# Patient Record
Sex: Male | Born: 1940 | Race: Black or African American | Hispanic: No | Smoking: Never smoker
Health system: Southern US, Community
[De-identification: ages and names within clinical notes are randomized; demographics above are authoritative.]

## PROBLEM LIST (undated history)

## (undated) DIAGNOSIS — E119 Type 2 diabetes mellitus without complications: Secondary | ICD-10-CM

## (undated) DIAGNOSIS — I10 Essential (primary) hypertension: Secondary | ICD-10-CM

## (undated) DIAGNOSIS — N189 Chronic kidney disease, unspecified: Secondary | ICD-10-CM

## (undated) DIAGNOSIS — R011 Cardiac murmur, unspecified: Secondary | ICD-10-CM

---

## 2015-10-18 ENCOUNTER — Inpatient Hospital Stay (HOSPITAL_COMMUNITY): Payer: Medicare Other

## 2015-10-18 ENCOUNTER — Inpatient Hospital Stay (HOSPITAL_COMMUNITY)
Admission: EM | Admit: 2015-10-18 | Discharge: 2015-10-19 | DRG: 948 | Disposition: A | Discharge status: Home Health | Payer: Medicare Other | Attending: Internal Medicine | Admitting: Internal Medicine

## 2015-10-18 ENCOUNTER — Encounter (HOSPITAL_COMMUNITY): Payer: Self-pay | Admitting: Emergency Medicine

## 2015-10-18 DIAGNOSIS — I129 Hypertensive chronic kidney disease with stage 1 through stage 4 chronic kidney disease, or unspecified chronic kidney disease: Secondary | ICD-10-CM | POA: Diagnosis not present

## 2015-10-18 DIAGNOSIS — I509 Heart failure, unspecified: Secondary | ICD-10-CM | POA: Diagnosis not present

## 2015-10-18 DIAGNOSIS — R011 Cardiac murmur, unspecified: Secondary | ICD-10-CM | POA: Diagnosis not present

## 2015-10-18 DIAGNOSIS — N184 Chronic kidney disease, stage 4 (severe): Secondary | ICD-10-CM | POA: Diagnosis present

## 2015-10-18 DIAGNOSIS — N170 Acute kidney failure with tubular necrosis: Secondary | ICD-10-CM

## 2015-10-18 DIAGNOSIS — D631 Anemia in chronic kidney disease: Secondary | ICD-10-CM | POA: Diagnosis present

## 2015-10-18 DIAGNOSIS — E1122 Type 2 diabetes mellitus with diabetic chronic kidney disease: Secondary | ICD-10-CM | POA: Diagnosis present

## 2015-10-18 DIAGNOSIS — E119 Type 2 diabetes mellitus without complications: Secondary | ICD-10-CM | POA: Diagnosis present

## 2015-10-18 DIAGNOSIS — R809 Proteinuria, unspecified: Secondary | ICD-10-CM | POA: Diagnosis present

## 2015-10-18 DIAGNOSIS — I10 Essential (primary) hypertension: Secondary | ICD-10-CM | POA: Diagnosis present

## 2015-10-18 DIAGNOSIS — Z23 Encounter for immunization: Secondary | ICD-10-CM

## 2015-10-18 DIAGNOSIS — R531 Weakness: Secondary | ICD-10-CM

## 2015-10-18 DIAGNOSIS — R0602 Shortness of breath: Secondary | ICD-10-CM

## 2015-10-18 DIAGNOSIS — Z79899 Other long term (current) drug therapy: Secondary | ICD-10-CM

## 2015-10-18 DIAGNOSIS — E785 Hyperlipidemia, unspecified: Secondary | ICD-10-CM | POA: Diagnosis present

## 2015-10-18 DIAGNOSIS — N289 Disorder of kidney and ureter, unspecified: Secondary | ICD-10-CM

## 2015-10-18 DIAGNOSIS — N179 Acute kidney failure, unspecified: Secondary | ICD-10-CM | POA: Diagnosis present

## 2015-10-18 HISTORY — DX: Chronic kidney disease, unspecified: N18.9

## 2015-10-18 HISTORY — DX: Type 2 diabetes mellitus without complications: E11.9

## 2015-10-18 HISTORY — DX: Essential (primary) hypertension: I10

## 2015-10-18 HISTORY — DX: Cardiac murmur, unspecified: R01.1

## 2015-10-18 LAB — CK: Total CK: 66 U/L (ref 49–397)

## 2015-10-18 LAB — BASIC METABOLIC PANEL
Anion gap: 11 (ref 5–15)
BUN: 35 mg/dL — AB (ref 6–20)
CALCIUM: 9.1 mg/dL (ref 8.9–10.3)
CHLORIDE: 107 mmol/L (ref 101–111)
CO2: 16 mmol/L — ABNORMAL LOW (ref 22–32)
CREATININE: 3.75 mg/dL — AB (ref 0.61–1.24)
GFR calc Af Amer: 17 mL/min — ABNORMAL LOW (ref >60)
GFR calc non Af Amer: 14 mL/min — ABNORMAL LOW (ref >60)
Glucose, Bld: 194 mg/dL — ABNORMAL HIGH (ref 65–99)
Potassium: 4.1 mmol/L (ref 3.5–5.1)
SODIUM: 134 mmol/L — AB (ref 135–145)

## 2015-10-18 LAB — CBC
HCT: 29.5 % — ABNORMAL LOW (ref 39.0–52.0)
Hemoglobin: 9.9 g/dL — ABNORMAL LOW (ref 13.0–17.0)
MCH: 25.5 pg — AB (ref 26.0–34.0)
MCHC: 33.6 g/dL (ref 30.0–36.0)
MCV: 76 fL — AB (ref 78.0–100.0)
PLATELETS: 186 10*3/uL (ref 150–400)
RBC: 3.88 MIL/uL — ABNORMAL LOW (ref 4.22–5.81)
RDW: 15.6 % — AB (ref 11.5–15.5)
WBC: 6.6 10*3/uL (ref 4.0–10.5)

## 2015-10-18 LAB — URINALYSIS, ROUTINE W REFLEX MICROSCOPIC
Bilirubin Urine: NEGATIVE
GLUCOSE, UA: 100 mg/dL — AB
Ketones, ur: NEGATIVE mg/dL
Leukocytes, UA: NEGATIVE
Nitrite: NEGATIVE
Protein, ur: >300 mg/dL — AB
SPECIFIC GRAVITY, URINE: 1.015 (ref 1.005–1.030)
pH: 5.5 (ref 5.0–8.0)

## 2015-10-18 LAB — ECHOCARDIOGRAM COMPLETE
Height: 67 in
Weight: 3054.69 oz

## 2015-10-18 LAB — HEPATIC FUNCTION PANEL
ALK PHOS: 62 U/L (ref 38–126)
ALT: 10 U/L — AB (ref 17–63)
AST: 15 U/L (ref 15–41)
Albumin: 3.7 g/dL (ref 3.5–5.0)
BILIRUBIN DIRECT: 0.1 mg/dL (ref 0.1–0.5)
BILIRUBIN INDIRECT: 0.8 mg/dL (ref 0.3–0.9)
BILIRUBIN TOTAL: 0.9 mg/dL (ref 0.3–1.2)
Total Protein: 8.3 g/dL — ABNORMAL HIGH (ref 6.5–8.1)

## 2015-10-18 LAB — CREATININE, URINE, RANDOM: Creatinine, Urine: 143.38 mg/dL

## 2015-10-18 LAB — FERRITIN: FERRITIN: 176 ng/mL (ref 24–336)

## 2015-10-18 LAB — CBG MONITORING, ED: Glucose-Capillary: 212 mg/dL — ABNORMAL HIGH (ref 65–99)

## 2015-10-18 LAB — URINE MICROSCOPIC-ADD ON

## 2015-10-18 LAB — GLUCOSE, CAPILLARY
GLUCOSE-CAPILLARY: 215 mg/dL — AB (ref 65–99)
Glucose-Capillary: 181 mg/dL — ABNORMAL HIGH (ref 65–99)

## 2015-10-18 LAB — FOLATE: Folate: 7.9 ng/mL (ref >5.9)

## 2015-10-18 LAB — OSMOLALITY: Osmolality: 300 mOsm/kg — ABNORMAL HIGH (ref 275–295)

## 2015-10-18 LAB — IRON AND TIBC
IRON: 27 ug/dL — AB (ref 45–182)
Saturation Ratios: 10 % — ABNORMAL LOW (ref 17.9–39.5)
TIBC: 266 ug/dL (ref 250–450)
UIBC: 239 ug/dL

## 2015-10-18 LAB — OSMOLALITY, URINE: Osmolality, Ur: 392 mOsm/kg (ref 300–900)

## 2015-10-18 LAB — RETICULOCYTES
RBC.: 3.99 MIL/uL — AB (ref 4.22–5.81)
RETIC COUNT ABSOLUTE: 119.7 10*3/uL (ref 19.0–186.0)
Retic Ct Pct: 3 % (ref 0.4–3.1)

## 2015-10-18 LAB — SODIUM, URINE, RANDOM: SODIUM UR: 60 mmol/L

## 2015-10-18 LAB — VITAMIN B12: Vitamin B-12: 278 pg/mL (ref 180–914)

## 2015-10-18 LAB — TSH: TSH: 1.64 u[IU]/mL (ref 0.350–4.500)

## 2015-10-18 MED ORDER — SODIUM CHLORIDE 0.9 % IV BOLUS (SEPSIS)
500.0000 mL | Freq: Once | INTRAVENOUS | Status: AC
  Administered 2015-10-18: 500 mL via INTRAVENOUS

## 2015-10-18 MED ORDER — NIFEDIPINE ER OSMOTIC RELEASE 90 MG PO TB24
90.0000 mg | ORAL_TABLET | Freq: Every day | ORAL | Status: DC
  Administered 2015-10-18 – 2015-10-19 (×2): 90 mg via ORAL
  Filled 2015-10-18 (×2): qty 1

## 2015-10-18 MED ORDER — HYDRALAZINE HCL 20 MG/ML IJ SOLN: 10.0000 mg | Freq: Four times a day (QID) | INTRAMUSCULAR | Status: DC | PRN

## 2015-10-18 MED ORDER — ONDANSETRON HCL 4 MG/2ML IJ SOLN
4.0000 mg | Freq: Four times a day (QID) | INTRAMUSCULAR | Status: DC | PRN
Start: 2015-10-18 — End: 2015-10-19

## 2015-10-18 MED ORDER — PNEUMOCOCCAL VAC POLYVALENT 25 MCG/0.5ML IJ INJ
0.5000 mL | INJECTION | INTRAMUSCULAR | Status: AC
  Administered 2015-10-19: 0.5 mL via INTRAMUSCULAR
  Filled 2015-10-18 (×2): qty 0.5

## 2015-10-18 MED ORDER — HEPARIN SODIUM (PORCINE) 5000 UNIT/ML IJ SOLN
5000.0000 [IU] | Freq: Three times a day (TID) | INTRAMUSCULAR | Status: DC
  Administered 2015-10-18 – 2015-10-19 (×4): 5000 [IU] via SUBCUTANEOUS
  Filled 2015-10-18 (×4): qty 1

## 2015-10-18 MED ORDER — PRAVASTATIN SODIUM 80 MG PO TABS
80.0000 mg | ORAL_TABLET | Freq: Every day | ORAL | Status: DC
  Administered 2015-10-18 – 2015-10-19 (×2): 80 mg via ORAL
  Filled 2015-10-18 (×2): qty 1

## 2015-10-18 MED ORDER — ONDANSETRON HCL 4 MG PO TABS: 4.0000 mg | ORAL_TABLET | Freq: Four times a day (QID) | ORAL | Status: DC | PRN

## 2015-10-18 MED ORDER — SODIUM CHLORIDE 0.9 % IV SOLN
INTRAVENOUS | Status: AC
  Administered 2015-10-18 – 2015-10-19 (×3): via INTRAVENOUS

## 2015-10-18 MED ORDER — INSULIN ASPART 100 UNIT/ML ~~LOC~~ SOLN
0.0000 [IU] | Freq: Three times a day (TID) | SUBCUTANEOUS | Status: DC
  Administered 2015-10-18: 3 [IU] via SUBCUTANEOUS
  Administered 2015-10-19: 2 [IU] via SUBCUTANEOUS
  Administered 2015-10-19: 5 [IU] via SUBCUTANEOUS
  Administered 2015-10-19: 3 [IU] via SUBCUTANEOUS

## 2015-10-18 MED ORDER — INSULIN ASPART 100 UNIT/ML ~~LOC~~ SOLN: 0.0000 [IU] | Freq: Every day | SUBCUTANEOUS | Status: DC

## 2015-10-18 MED ORDER — AMLODIPINE BESYLATE 10 MG PO TABS
10.0000 mg | ORAL_TABLET | Freq: Every day | ORAL | Status: DC
  Administered 2015-10-18 – 2015-10-19 (×2): 10 mg via ORAL
  Filled 2015-10-18 (×2): qty 1

## 2015-10-18 MED ORDER — ALBUTEROL SULFATE (2.5 MG/3ML) 0.083% IN NEBU: 2.5000 mg | INHALATION_SOLUTION | RESPIRATORY_TRACT | Status: DC | PRN

## 2015-10-18 MED ORDER — HYDROCODONE-ACETAMINOPHEN 5-325 MG PO TABS: 1.0000 | ORAL_TABLET | ORAL | Status: DC | PRN

## 2015-10-18 MED ORDER — CARVEDILOL 25 MG PO TABS
25.0000 mg | ORAL_TABLET | Freq: Two times a day (BID) | ORAL | Status: DC
  Administered 2015-10-18 – 2015-10-19 (×3): 25 mg via ORAL
  Filled 2015-10-18 (×3): qty 1

## 2015-10-18 NOTE — Progress Notes (Signed)
Pt states he is a full time [place] resident and here visiting a friend.  He rents a home here when he visits.    Attempted to obtain medical records [facility] [phone]--medical records currently closed on weekend, will open on Monday.

## 2015-10-18 NOTE — ED Notes (Signed)
Attempted report 

## 2015-10-18 NOTE — ED Notes (Signed)
Bed: WA22 Expected date:  Expected time:  Means of arrival:  Comments: 

## 2015-10-18 NOTE — ED Notes (Signed)
Bed: WA20 Expected date:  Expected time:  Means of arrival:  Comments: 

## 2015-10-18 NOTE — ED Notes (Signed)
CBG measurement /dl. RN advised. ENM

## 2015-10-18 NOTE — ED Notes (Signed)
Lab would need four gold recollected ----RN have been made aware

## 2015-10-18 NOTE — ED Triage Notes (Signed)
Patient states that he was recently in [place] visiting family and since the past week he has been home having fatigue/weakness.  Patient states that he is a diabetic and does monitor them regularly.

## 2015-10-18 NOTE — ED Provider Notes (Signed)
[facility]-EMERGENCY DEPT Provider Note   CSN: [id number] Arrival date & time: 10/18/15  1030  First Provider Contact:  None       History   Chief Complaint Chief Complaint  Patient presents with  . Weakness    HPI Maurice Glover is a 75 y.o. male.  75 year old male presents with increasing weakness and fatigue for the past week. States that symptoms become worse after he takes his diabetic medication. Notes that he only eats one meal per day. Denies any recent fever, vomiting, diarrhea. No abdominal chest discomfort. No focality to his weakness. No severe headaches or visual changes. Denies polyuria or polydipsia. Has taken his blood sugar at home and has been running in the low 200s. Denies any syncope or near-syncope.      Past Medical History:  Diagnosis Date  . Diabetes mellitus without complication (HCC)   . Hypertension     There are no active problems to display for this patient.   History reviewed. No pertinent surgical history.     Home Medications    Prior to Admission medications   Medication Sig Start Date End Date Taking? Authorizing Provider  amLODipine (NORVASC) 10 MG tablet Take 10 mg by mouth daily.   Yes Historical Provider, MD  carvedilol (COREG) 25 MG tablet Take 25 mg by mouth 2 (two) times daily with a meal.   Yes Historical Provider, MD  NIFEdipine (PROCARDIA XL/ADALAT-CC) 90 MG 24 hr tablet Take 90 mg by mouth daily.   Yes Historical Provider, MD  pravastatin (PRAVACHOL) 80 MG tablet Take 80 mg by mouth daily.   Yes Historical Provider, MD  sitaGLIPtin (JANUVIA) 25 MG tablet Take 25 mg by mouth daily.   Yes Historical Provider, MD    Family History No family history on file.  Social History Social History  Substance Use Topics  . Smoking status: Never Smoker  . Smokeless tobacco: Never Used  . Alcohol use No     Allergies   Review of patient's allergies indicates no known allergies.   Review of Systems Review of Systems  All other  systems reviewed and are negative.    Physical Exam Updated Vital Signs BP 153/92 (BP Location: Right Arm)   Pulse 80   Temp 98.1 F (36.7 C) (Oral)   Resp 18   SpO2 98%   Physical Exam  Constitutional: He is oriented to person, place, and time. He appears well-developed and well-nourished.  Non-toxic appearance. No distress.  HENT:  Head: Normocephalic and atraumatic.  Eyes: Conjunctivae, EOM and lids are normal. Pupils are equal, round, and reactive to light.  Neck: Normal range of motion. Neck supple. No tracheal deviation present. No thyroid mass present.  Cardiovascular: Normal rate, regular rhythm and normal heart sounds.  Exam reveals no gallop.   No murmur heard. Pulmonary/Chest: Effort normal and breath sounds normal. No stridor. No respiratory distress. He has no decreased breath sounds. He has no wheezes. He has no rhonchi. He has no rales.  Abdominal: Soft. Normal appearance and bowel sounds are normal. He exhibits no distension. There is no tenderness. There is no rebound and no CVA tenderness.  Musculoskeletal: Normal range of motion. He exhibits no edema or tenderness.  Neurological: He is alert and oriented to person, place, and time. He has normal strength. No cranial nerve deficit or sensory deficit. GCS eye subscore is 4. GCS verbal subscore is 5. GCS motor subscore is 6.  Skin: Skin is warm and dry. No abrasion and no rash  noted.  Psychiatric: He has a normal mood and affect. His speech is normal and behavior is normal.  Nursing note and vitals reviewed.    ED Treatments / Results  Labs (all labs ordered are listed, but only abnormal results are displayed) Labs Reviewed  BASIC METABOLIC PANEL  CBC  URINALYSIS, ROUTINE W REFLEX MICROSCOPIC (NOT AT [facility])  CBG MONITORING, ED    EKG  EKG Interpretation None       Radiology No results found.  Procedures Procedures (including critical care time)  Medications Ordered in ED Medications - No data to  display   Initial Impression / Assessment and Plan / ED Course  I have reviewed the triage vital signs and the nursing notes.  Pertinent labs & imaging results that were available during my care of the patient were reviewed by me and considered in my medical decision making (see chart for details).  Clinical Course  Patient will be admitted for evaluation of his renal insufficiency  Final Clinical Impressions(s) / ED Diagnoses   Final diagnoses:  None    New Prescriptions New Prescriptions   No medications on file     Lorre Nick, MD 10/18/15 1227

## 2015-10-18 NOTE — ED Notes (Signed)
Bladder scan completed. Measurements ranging 4819ml-80ml. RN advised. ENM

## 2015-10-18 NOTE — H&P (Signed)
[facility] H&P   Patient Demographics:    Maurice Glover, is a [age] y.o. male  MRN: [id number]   DOB - 07/28/1940  Admit Date - 10/18/2015  Outpatient Primary MD for the patient is No PCP Per Patient  Outpatient Specialists: All in [place]   Patient coming from: Home in [place]  Chief Complaint  Patient presents with  . Weakness      HPI:    Maurice Crazelban Cammack  is a 75 y.o. male, History of type 2 diabetes mellitus, essential hypertension, CK D stage V he has been told in [place] that he should start preparing for dialysis who was originally from [place] and is visiting family here comes in with one-week history of generalized weakness, he says when he gets up in the morning he feels good but as the day prolongs he feels increasingly fatigued and tired, he denies any fever or chills, no headache, no chest abdominal pain, no diarrhea or dysuria, no blood in stool or urine or focal weakness.  He came to the ER where he was found to have a creatinine of above 3.5 and I was called to admit for renal failure. However I think his kidney issues are more chronic than acute. Currently negative review of systems.    Review of systems:    In addition to the HPI above,  No Fever-chills, No Headache, No changes with Vision or hearing, No problems swallowing food or Liquids, No Chest pain, Cough or Shortness of Breath, No Abdominal pain, No Nausea or Vommitting, Bowel movements are regular, No Blood in stool or Urine, No dysuria, No new skin rashes or bruises, No new joints pains-aches,  No new weakness, tingling, numbness in any extremity,Positive generalized weakness No recent weight gain or loss, No polyuria, polydypsia or  polyphagia, No significant Mental Stressors.  A full 10 point Review of Systems was done, except as stated above, all other Review of Systems were negative.   With Past History of the following :    Past Medical History:  Diagnosis Date  . Diabetes mellitus without complication (HCC)   . Hypertension       History reviewed. No pertinent surgical history.    Social History:     Social History  Substance Use Topics  . Smoking status: Never Smoker  . Smokeless  tobacco: Never Used  . Alcohol use No         Family History :   NO History of ESRD on dialysis   Home Medications:   Prior to Admission medications   Medication Sig Start Date End Date Taking? Authorizing Provider  amLODipine (NORVASC) 10 MG tablet Take 10 mg by mouth daily.   Yes Historical Provider, MD  carvedilol (COREG) 25 MG tablet Take 25 mg by mouth 2 (two) times daily with a meal.   Yes Historical Provider, MD  NIFEdipine (PROCARDIA XL/ADALAT-CC) 90 MG 24 hr tablet Take 90 mg by mouth daily.   Yes Historical Provider, MD  pravastatin (PRAVACHOL) 80 MG tablet Take 80 mg by mouth daily.   Yes Historical Provider, MD  sitaGLIPtin (JANUVIA) 25 MG tablet Take 25 mg by mouth daily.   Yes Historical Provider, MD     Allergies:    No Known Allergies   Physical Exam:   Vitals  Blood pressure 153/92, pulse 80, temperature 98.1 F (36.7 C), temperature source Oral, resp. rate 18, SpO2 98 %.   1. General African-American male lying in bed in NAD,     2. Normal affect and insight, Not Suicidal or Homicidal, Awake Alert, Oriented X 3.  3. No F.N deficits, ALL C.Nerves Intact, Strength 5/5 all 4 extremities, Sensation intact all 4 extremities, Plantars down going.  4. Ears and Eyes appear Normal, Conjunctivae clear, PERRLA. Moist Oral Mucosa.  5. Supple Neck, No JVD, No cervical lymphadenopathy appriciated, No Carotid Bruits.  6. Symmetrical Chest wall movement, Good air movement bilaterally,  CTAB.  7. RRR, No Gallops, Rubs , positive aortic systolic murmur, No Parasternal Heave.  8. Positive Bowel Sounds, Abdomen Soft, No tenderness, No organomegaly appriciated,No rebound -guarding or rigidity.  9.  No Cyanosis, Normal Skin Turgor, No Skin Rash or Bruise.  10. Good muscle tone,  joints appear normal , no effusions, Normal ROM.  11. No Palpable Lymph Nodes in Neck or Axillae      Data Review:    CBC  Recent Labs Lab 10/18/15 1123  WBC 6.6  HGB 9.9*  HCT 29.5*  PLT 186  MCV 76.0*  MCH 25.5*  MCHC 33.6  RDW 15.6*   ------------------------------------------------------------------------------------------------------------------  Chemistries   Recent Labs Lab 10/18/15 1123  NA 134*  K 4.1  CL 107  CO2 16*  GLUCOSE 194*  BUN 35*  CREATININE 3.75*  CALCIUM 9.1   ------------------------------------------------------------------------------------------------------------------ CrCl cannot be calculated (Unknown ideal weight.). ------------------------------------------------------------------------------------------------------------------ No results for input(s): TSH, T4TOTAL, T3FREE, THYROIDAB in the last 72 hours.  Invalid input(s): FREET3  Coagulation profile No results for input(s): INR, PROTIME in the last 168 hours. ------------------------------------------------------------------------------------------------------------------- No results for input(s): DDIMER in the last 72 hours. -------------------------------------------------------------------------------------------------------------------  Cardiac Enzymes No results for input(s): CKMB, TROPONINI, MYOGLOBIN in the last 168 hours.  Invalid input(s): CK ------------------------------------------------------------------------------------------------------------------ No results found for:  BNP   ---------------------------------------------------------------------------------------------------------------  Urinalysis No results found for: COLORURINE, APPEARANCEUR, LABSPEC, PHURINE, GLUCOSEU, HGBUR, BILIRUBINUR, KETONESUR, PROTEINUR, UROBILINOGEN, NITRITE, LEUKOCYTESUR  ----------------------------------------------------------------------------------------------------------------   Imaging Results:    No results found.   Baseline EKG and chest x-ray ordered   Assessment & Plan:      1. Generalized weakness. 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 Will be admitted, gentle IV fluids, supportive care, PT evaluation, anemia panel. Check chest x-ray, UA and EKG. Check TSH. We'll check CK levels and liver enzymes as he is  on high doses of statin.  2. CK D stage V. Patient has been told in [place] to prepare for dialysis, I suspect this could be his baseline creatinine however ARF cannot be ruled out, will check bladder scan and renal ultrasound, UA, gentle hydration and monitor. Avoid nephrotoxins. Have requested RN to obtain records from [place].  3. Systolic murmur. Patient unaware of the same, check echo. Likely underlying left ear.  4. Hypertension. Continue home medications and as needed IV hydrazine  5. DM type II. Check A1c for now sliding-scale hold Januvia.  6. Anemia of chronic disease. Check anemia panel.  7. Dyslipidemia. On statin continue. Check CK levels and liver enzymes due to fatigue.   DVT Prophylaxis Heparin   AM Labs Ordered, also please review Full Orders  Family Communication: Admission, patients condition and plan of care including tests being ordered have been discussed with the patient and wife who indicate understanding and agree with the plan and Code Status.  Code Status Full  Likely DC to  Home 2-3 days  Condition Fair  Consults called: None    Admission status:  Inpt    Time spent in minutes : 35   Susa Raring K M.D on 10/18/2015 at 12:49 PM  Between 7am to 7pm - Pager - [phone]. After 7pm go to [email or web address] - password [facility]  [facility] - Office  [phone]

## 2015-10-18 NOTE — Progress Notes (Signed)
  Echocardiogram 2D Echocardiogram has been performed.  Janalyn HarderWest, Mahoganie Basher R 10/18/2015, 3:50 PM

## 2015-10-19 DIAGNOSIS — R531 Weakness: Secondary | ICD-10-CM

## 2015-10-19 DIAGNOSIS — I10 Essential (primary) hypertension: Secondary | ICD-10-CM

## 2015-10-19 DIAGNOSIS — N184 Chronic kidney disease, stage 4 (severe): Secondary | ICD-10-CM

## 2015-10-19 DIAGNOSIS — E119 Type 2 diabetes mellitus without complications: Secondary | ICD-10-CM

## 2015-10-19 LAB — BASIC METABOLIC PANEL
ANION GAP: 7 (ref 5–15)
BUN: 35 mg/dL — AB (ref 6–20)
CHLORIDE: 108 mmol/L (ref 101–111)
CO2: 19 mmol/L — AB (ref 22–32)
Calcium: 8.5 mg/dL — ABNORMAL LOW (ref 8.9–10.3)
Creatinine, Ser: 3.39 mg/dL — ABNORMAL HIGH (ref 0.61–1.24)
GFR calc Af Amer: 19 mL/min — ABNORMAL LOW (ref >60)
GFR, EST NON AFRICAN AMERICAN: 16 mL/min — AB (ref >60)
GLUCOSE: 207 mg/dL — AB (ref 65–99)
POTASSIUM: 4.3 mmol/L (ref 3.5–5.1)
Sodium: 134 mmol/L — ABNORMAL LOW (ref 135–145)

## 2015-10-19 LAB — GLUCOSE, CAPILLARY
GLUCOSE-CAPILLARY: 227 mg/dL — AB (ref 65–99)
Glucose-Capillary: 220 mg/dL — ABNORMAL HIGH (ref 65–99)
Glucose-Capillary: 262 mg/dL — ABNORMAL HIGH (ref 65–99)
Glucose-Capillary: 198 mg/dL — ABNORMAL HIGH (ref 65–99)

## 2015-10-19 LAB — CBC
HEMATOCRIT: 27.1 % — AB (ref 39.0–52.0)
HEMOGLOBIN: 9.1 g/dL — AB (ref 13.0–17.0)
MCH: 25.6 pg — ABNORMAL LOW (ref 26.0–34.0)
MCHC: 33.6 g/dL (ref 30.0–36.0)
MCV: 76.1 fL — AB (ref 78.0–100.0)
Platelets: 173 10*3/uL (ref 150–400)
RBC: 3.56 MIL/uL — ABNORMAL LOW (ref 4.22–5.81)
RDW: 15.5 % (ref 11.5–15.5)
WBC: 6.3 10*3/uL (ref 4.0–10.5)

## 2015-10-19 LAB — URINE CULTURE: Culture: NO GROWTH

## 2015-10-19 MED ORDER — VITAMIN B-12 1000 MCG PO TABS: 1000.0000 ug | ORAL_TABLET | Freq: Every day | ORAL | 0 refills | Status: AC

## 2015-10-19 MED ORDER — CYANOCOBALAMIN 1000 MCG/ML IJ SOLN
1000.0000 ug | Freq: Once | INTRAMUSCULAR | Status: AC
  Administered 2015-10-19: 1000 ug via SUBCUTANEOUS
  Filled 2015-10-19: qty 1

## 2015-10-19 MED ORDER — VITAMIN B-12 1000 MCG PO TABS: 1000.0000 ug | ORAL_TABLET | Freq: Every day | ORAL | 0 refills | Status: DC

## 2015-10-19 NOTE — Discharge Summary (Addendum)
Physician Discharge Summary  Maurice Glover ZOX:[id number] DOB: 1941-01-28 DOA: 10/18/2015  PCP: No PCP Per Patient  Admit date: 10/18/2015 Discharge date: 10/19/2015  Admitted From: home  Disposition:  home   Recommendations for Outpatient Follow-up:  1. F/u with PCP- f/u B12 level in 1 month  Home Health:  PT ordered  Equipment/Devices:  walker    Discharge Condition:  stable   CODE STATUS:  Full code   Diet recommendation:  Renal, heart healthy diet Consultations:  none    Discharge Diagnoses:  Principal Problem:   Generalized weakness Active Problems:   Essential hypertension   Diabetes mellitus without complication (HCC)   Hyperlipemia   Chronic kidney disease (CKD), stage IV (severe) (HCC)    Subjective: Feels weak when walking. No new complaints.   Brief Summary: Maurice Glover  is a 75 y.o. male, History of type 2 diabetes mellitus, essential hypertension, CK D stage V he has been told in [place] that he should start preparing for dialysis who was originally from [place] and is visiting family here comes in with one-week history of generalized weakness, he says when he gets up in the morning he feels good but as the day prolongs he feels increasingly fatigued and tired, he denies any fever or chills, no headache, no chest abdominal pain, no diarrhea or dysuria, no blood in stool or urine or focal weakness.  Hospital Course:  Generalized weakness - no specific etiology has been found - not dehydrated- no recent weight loss - TSH, ECHO are normal - no acute infection seen - not on any new medications that can contribute to weakness - has low normal B12 but this would not cause generalized weakness- no numbness in feet or unstable gait - will recommend outpt PT due to his weakness   B12 level low normal- anemia (likely related to CKD, anemia of chronic disease) - begin oral replacement  CKD 4 - with proteinuria  - has nephrologist in [place] whom he follows  with  DM - sugars a bit high - in the 200s- resume Januvia on discharge  HTN - cont Norvasc, Coreg, Nifedipine  Discharge Instructions     Medication List    TAKE these medications   amLODipine 10 MG tablet Commonly known as:  NORVASC Take 10 mg by mouth daily.   carvedilol 25 MG tablet Commonly known as:  COREG Take 25 mg by mouth 2 (two) times daily with a meal.   NIFEdipine 90 MG 24 hr tablet Commonly known as:  PROCARDIA XL/ADALAT-CC Take 90 mg by mouth daily.   pravastatin 80 MG tablet Commonly known as:  PRAVACHOL Take 80 mg by mouth daily.   sitaGLIPtin 25 MG tablet Commonly known as:  JANUVIA Take 25 mg by mouth daily.   vitamin B-12 1000 MCG tablet Commonly known as:  CYANOCOBALAMIN Take 1 tablet (1,000 mcg total) by mouth daily.      Follow-up Information    [facility] .   Why:  please call to arrange appt Contact information: [address] [place] [address] [phone]       [facility] .   Specialty:  Internal Medicine Why:  if you are unable to arrange appointment with [facility], please contact [facility] to arrange appt with a primary care physician. Contact information: [address] [place] [address] [phone]         No Known  Allergies   Procedures/Studies:  US Renal  Result Date: 2015-11-17 CLINICAL DATA:  Acute renal failure. EXAM: RENAL / URINARY TRACT ULTRASOUND COMPLETE COMPARISON:  None. FINDINGS: Right Kidney: Length: 10.4 cm. Echogenicity within normal limits. No mass or hydronephrosis visualized. Left Kidney: Length: 10.4 cm. 1.1 cm upper pole cyst. Normal echogenicity. No hydronephrosis. Bladder: Appears normal for degree of bladder distention. IMPRESSION: No significant abnormality. Electronically Signed   By: Beckie Salts M.D.   On: November 17, 2015 18:15   Dg Chest Port 1 View  Result Date:  11-17-2015 CLINICAL DATA:  Fatigue/weakness x 1 week. States he "doesn't feel good". Pt is diabetic and has h/o HTN. Nonsmoker. EXAM: PORTABLE CHEST 1 VIEW COMPARISON:  None. FINDINGS: Cardiac silhouette is normal in size. Normal mediastinal and hilar contours. Clear lungs.  No pleural effusion or pneumothorax. Bony thorax is intact. IMPRESSION: No active disease. Electronically Signed   By: Amie Portland M.D.   On: 17-Nov-2015 13:23       Discharge Exam: Vitals:   10/19/15 1251 10/19/15 1444  BP: 117/69 124/74  Pulse:  69  Resp:  18  Temp:  98.4 F (36.9 C)   Vitals:   Nov 17, 2015 2030 10/19/15 0636 10/19/15 1251 10/19/15 1444  BP: (!) 149/79 122/71 117/69 124/74  Pulse: 82 78  69  Resp: Temp: 98.8 F (37.1 C) 98.7 F (37.1 C)  98.4 F (36.9 C)  TempSrc: Oral Oral  Oral  SpO2: 100% 98%  98%  Weight:      Height:        General: Pt is alert, awake, not in acute distress Cardiovascular: RRR, S1/S2 +, no rubs, no gallops Respiratory: CTA bilaterally, no wheezing, no rhonchi Abdominal: Soft, NT, ND, bowel sounds + Extremities: no edema, no cyanosis    The results of significant diagnostics from this hospitalization (including imaging, microbiology, ancillary and laboratory) are listed below for reference.     Microbiology: Recent Results (from the past 240 hour(s))  Urine culture     Status: None   Collection Time: 2015-11-17 12:29 PM  Result Value Ref Range Status   Specimen Description URINE, CLEAN CATCH  Final   Special Requests NONE  Final   Culture NO GROWTH Performed at [facility]   Final   Report Status 10/19/2015 FINAL  Final     Labs: BNP (last 3 results) No results for input(s): BNP in the last 8760 hours. Basic Metabolic Panel:  Recent Labs Lab 11/17/2015 1123 10/19/15 0511  NA 134* 134*  K 4.1 4.3  CL 107 108  CO2 16* 19*  GLUCOSE 194* 207*  BUN 35* 35*  CREATININE 3.75* 3.39*  CALCIUM 9.1 8.5*   Liver Function  Tests:  Recent Labs Lab 2015-11-17 1418  AST 15  ALT 10*  ALKPHOS 62  BILITOT 0.9  PROT 8.3*  ALBUMIN 3.7   No results for input(s): LIPASE, AMYLASE in the last 168 hours. No results for input(s): AMMONIA in the last 168 hours. CBC:  Recent Labs Lab 11-17-2015 1123 10/19/15 0511  WBC 6.6 6.3  HGB 9.9* 9.1*  HCT 29.5* 27.1*  MCV 76.0* 76.1*  PLT 186 173   Cardiac Enzymes:  Recent Labs Lab November 17, 2015 1418  CKTOTAL 66   BNP: Invalid input(s): POCBNP CBG:  Recent Labs Lab Nov 17, 2015 2038 10/19/15 0716 10/19/15 0717 10/19/15 1200 10/19/15 1700  GLUCAP 181* 220* 227* 262* 198*   D-Dimer No results for input(s): DDIMER in the last 72 hours. Hgb  A1c No results for input(s): HGBA1C in the last 72 hours. Lipid Profile No results for input(s): CHOL, HDL, LDLCALC, TRIG, CHOLHDL, LDLDIRECT in the last 72 hours. Thyroid function studies  Recent Labs  10/18/15 1418  TSH 1.640   Anemia work up  Recent Labs  10/18/15 1233 10/18/15 1418  VITAMINB12  --  278  FOLATE  --  7.9  FERRITIN  --  176  TIBC  --  266  IRON  --  27*  RETICCTPCT 3.0  --    Urinalysis    Component Value Date/Time   COLORURINE YELLOW 10/18/2015 1229   APPEARANCEUR CLEAR 10/18/2015 1229   LABSPEC 1.015 10/18/2015 1229   PHURINE 5.5 10/18/2015 1229   GLUCOSEU 100 (A) 10/18/2015 1229   HGBUR SMALL (A) 10/18/2015 1229   BILIRUBINUR NEGATIVE 10/18/2015 1229   KETONESUR NEGATIVE 10/18/2015 1229   PROTEINUR >300 (A) 10/18/2015 1229   NITRITE NEGATIVE 10/18/2015 1229   LEUKOCYTESUR NEGATIVE 10/18/2015 1229   Sepsis Labs Invalid input(s): PROCALCITONIN,  WBC,  LACTICIDVEN Microbiology Recent Results (from the past 240 hour(s))  Urine culture     Status: None   Collection Time: 10/18/15 12:29 PM  Result Value Ref Range Status   Specimen Description URINE, CLEAN CATCH  Final   Special Requests NONE  Final   Culture NO GROWTH [facility]   Final   Report Status  10/19/2015 FINAL  Final     Time coordinating discharge: Over 30 minutes  SIGNED:   Calvert CantorIZWAN,Jarmon Javid, MD  [facility] 10/19/2015, 5:30 PM Pager   If 7PM-7AM, please contact night-coverage [email or web address] Password [email or web address]

## 2015-10-19 NOTE — Care Management Note (Addendum)
Case Management Note  Patient Details  Name: Maurice Glover MRN: [id number] Date of Birth: 08-24-1940  Subjective/Objective:       HTN, DM, weakness             Action/Plan: Discharge Planning: AVS reviewed:   NCM spoke to pt and offered choice/HH list provided. [facility]. Message to attending for orders. Pt reports living at home alone. Requesting cane for home. Contacted [facility] for DME for home. Provided [facility] and [facility] to call an arrange appt. Sent email to [facility] RN, Erskine SquibbJane, and Shanda BumpsJessica with referral to [facility] program for follow up on HTN, DM.   [phone] [facility]. [facility].  Expected Discharge Date:  10/19/2015             Expected Discharge Plan:  Home w Home Health Services  In-House Referral:  NA  Discharge planning Services  CM Consult  Post Acute Care Choice:  Home Health Choice offered to:  Patient  DME Arranged:  Gilmer Morane DME Agency:  [facility]  HH Arranged:  [facility], PT, aide [facility]  [facility]  Status of Service:  Completed, signed off  If discussed at Long Length of Stay Meetings, dates discussed:    Additional Comments:  Elliot CousinShavis, Narayan Scull Ellen, RN 10/19/2015, 5:19 PM

## 2015-10-19 NOTE — Progress Notes (Signed)
Discharge instructions explained to pt. States he understands.Discharged via wheelchair, pt taking a taxie home.

## 2015-10-20 LAB — HEMOGLOBIN A1C
HEMOGLOBIN A1C: 9.3 % — AB (ref 4.8–5.6)
Mean Plasma Glucose: 220 mg/dL

## 2015-10-21 ENCOUNTER — Telehealth: Payer: Self-pay

## 2015-10-21 NOTE — Telephone Encounter (Signed)
Message received from Isidoro DonningAlesia Shavis, RN CM requesting a hospital follow up appointment for the [facility] and noting that he would benefit from the [facility] Clinic (TCC) services. Attempted to contact the patient to discuss his plans for follow up. He has only 1 hospitalization and would not qualify for TCC. It is noted that he is a full time resident of [place] and is here visiting.  Call placed to # [phone] (M) and a HIPAA compliant voicemail message was left requesting a call back to # [phone] or [phone].   Update provided to A. Mariea StableShavis, RN CM

## 2018-06-19 IMAGING — US US RENAL
1 series · 14 of 21 positions shown · non-contrast
Comparison: None.

CLINICAL DATA: Acute renal failure.

EXAM:
RENAL / URINARY TRACT ULTRASOUND COMPLETE

[Series 1: us renal · 0.21mm/px · 14 of 21 slices shown]
[im 1/21]
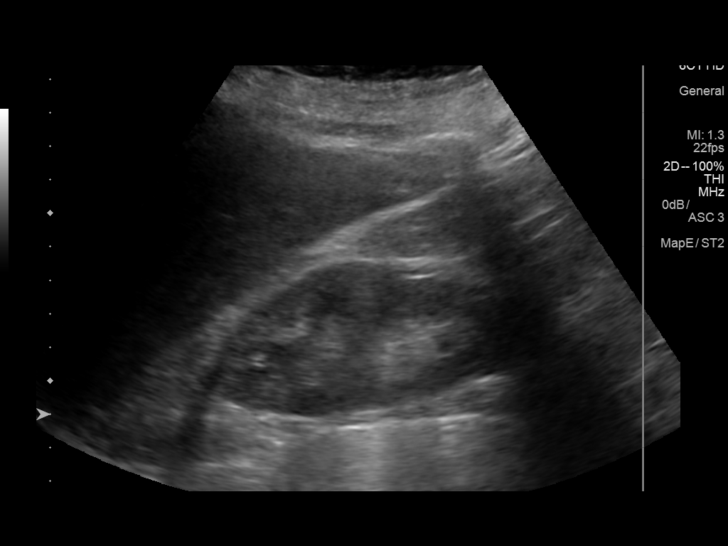
[im 3/21]
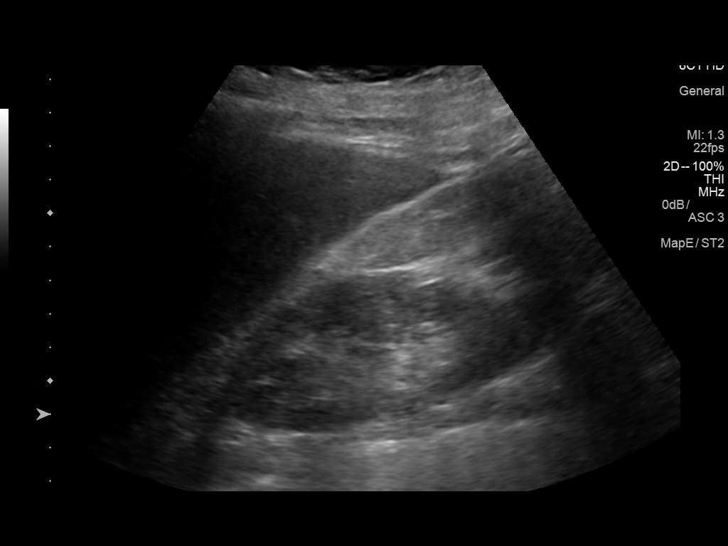
[im 4/21]
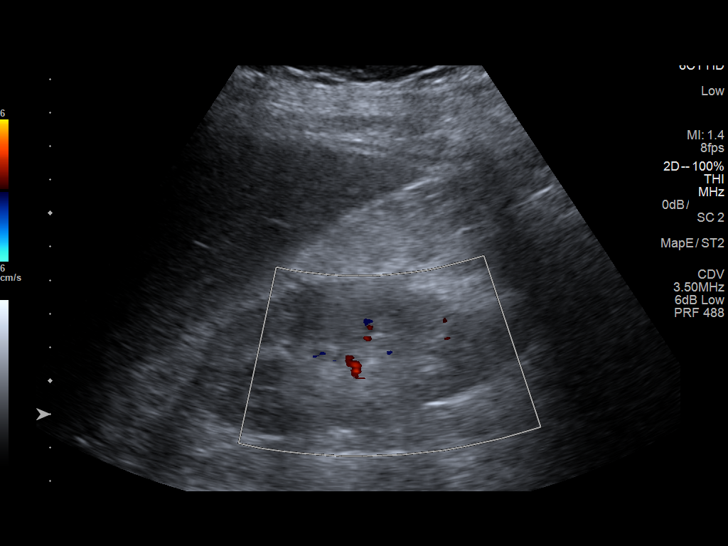
[im 6/21]
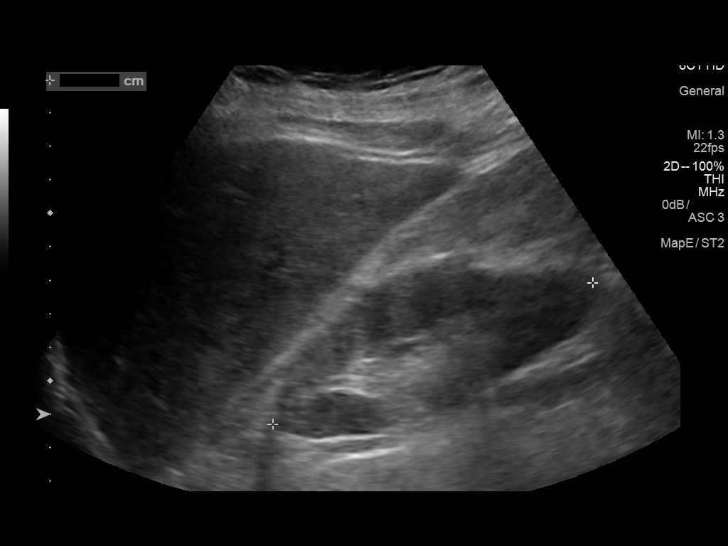
[im 7/21]
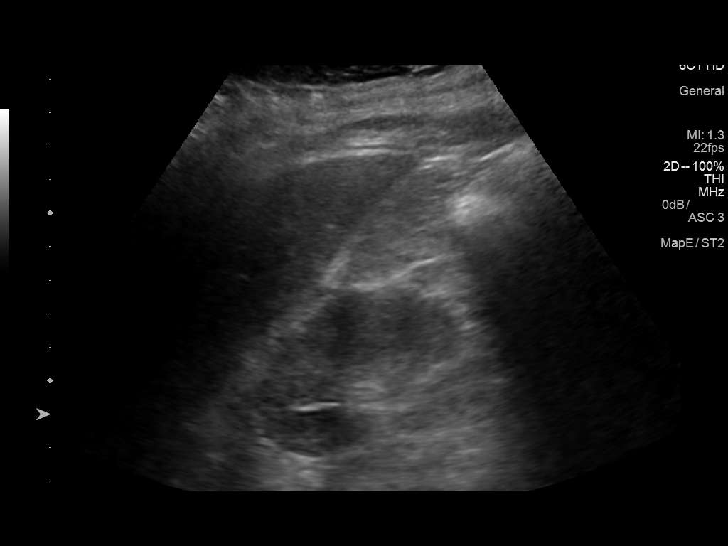
[im 9/21]
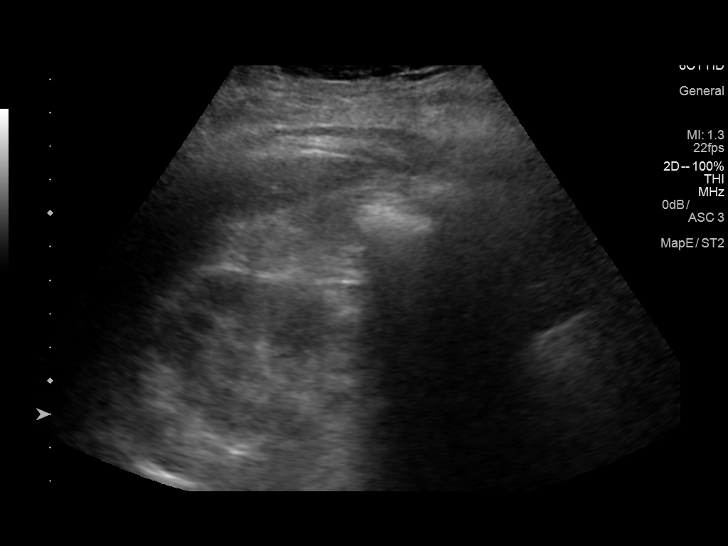
[im 10/21]
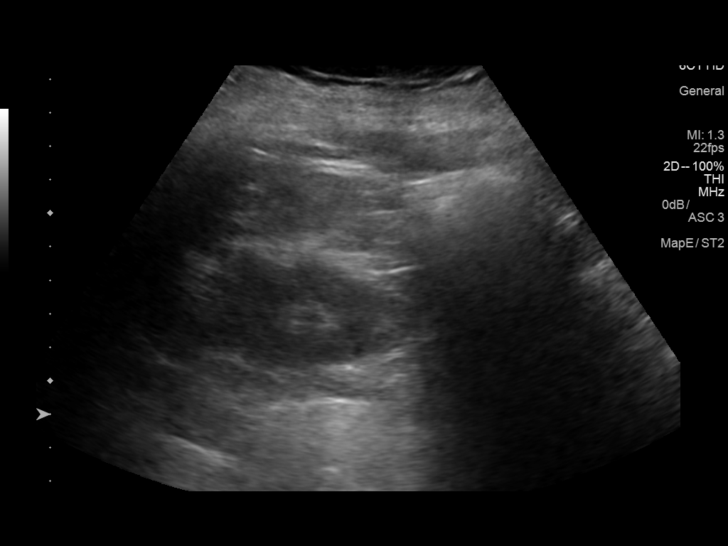
[im 12/21]
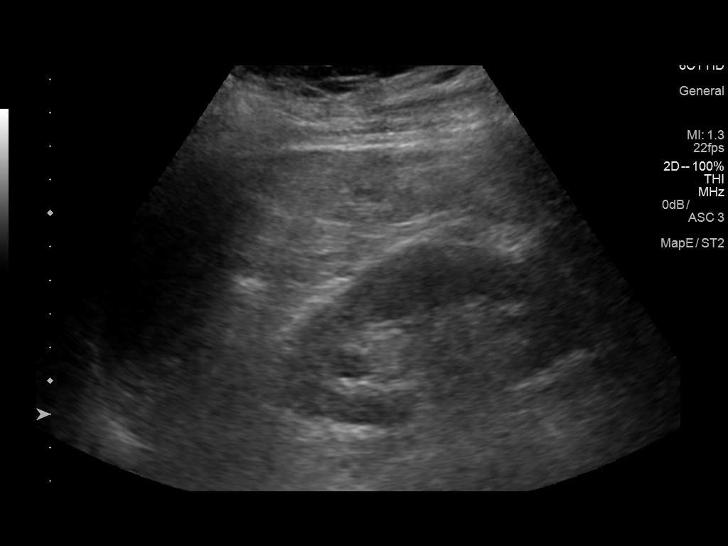
[im 13/21]
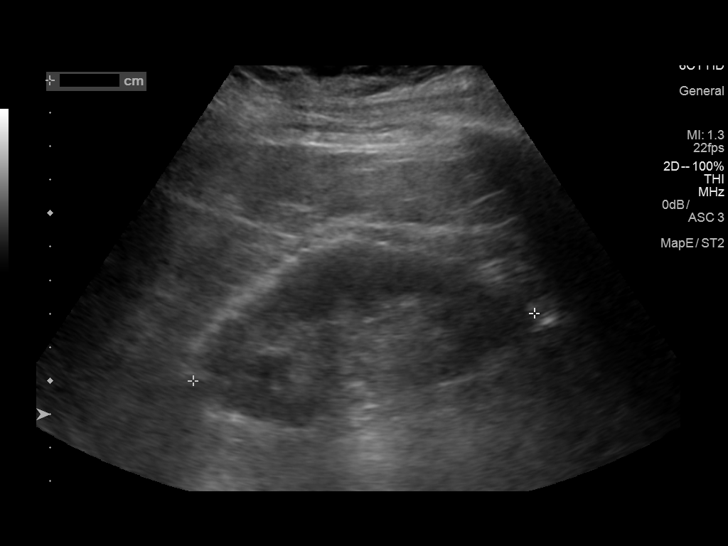
[im 15/21]
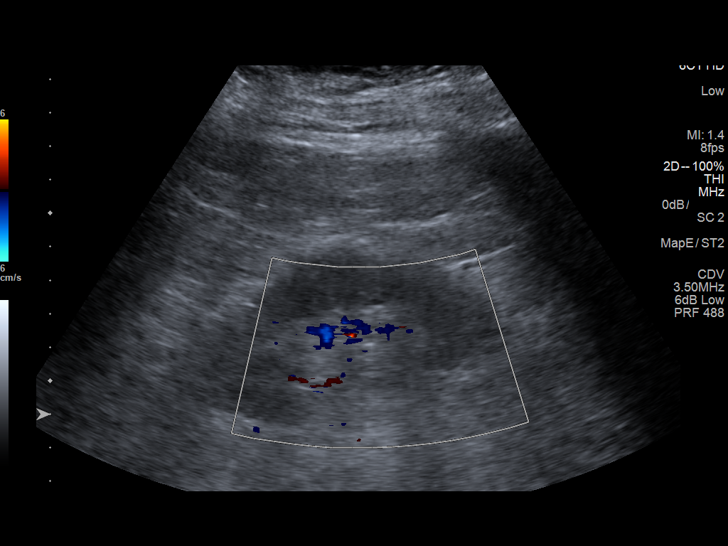
[im 16/21]
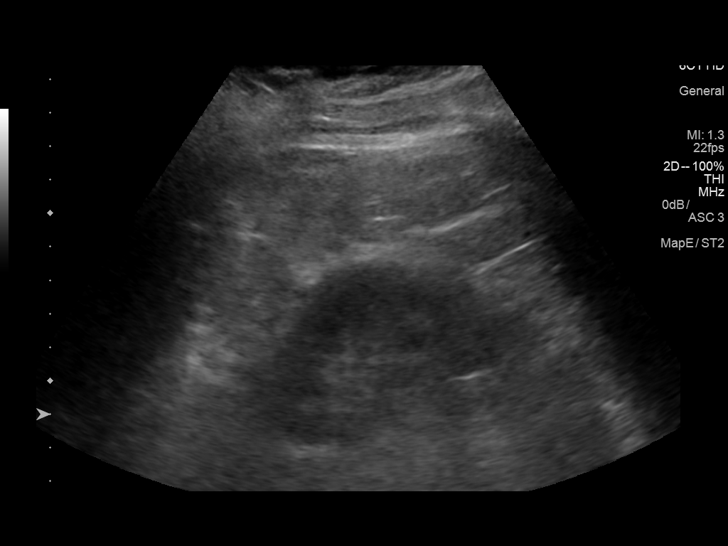
[im 18/21]
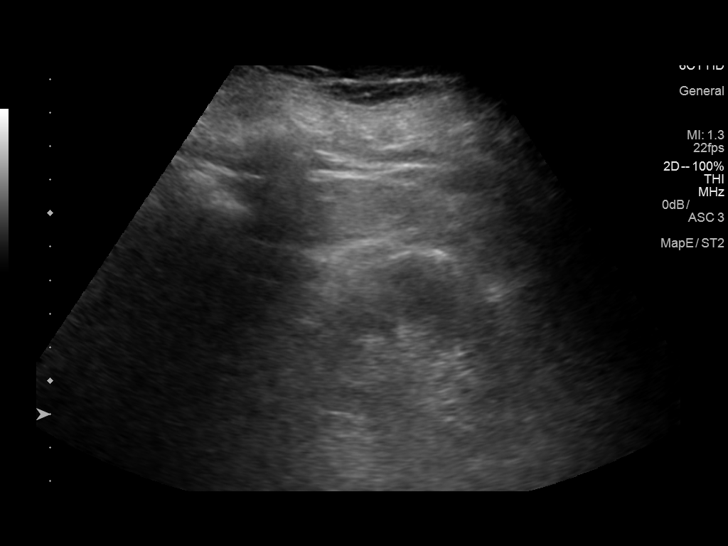
[im 19/21]
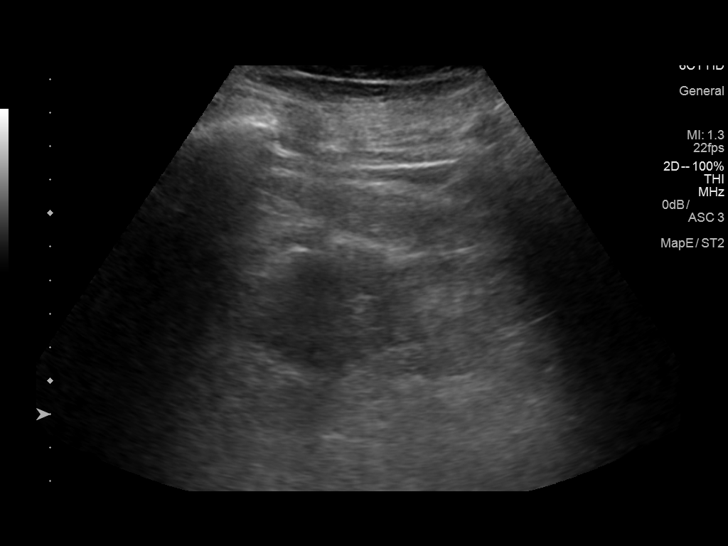
[im 21/21]
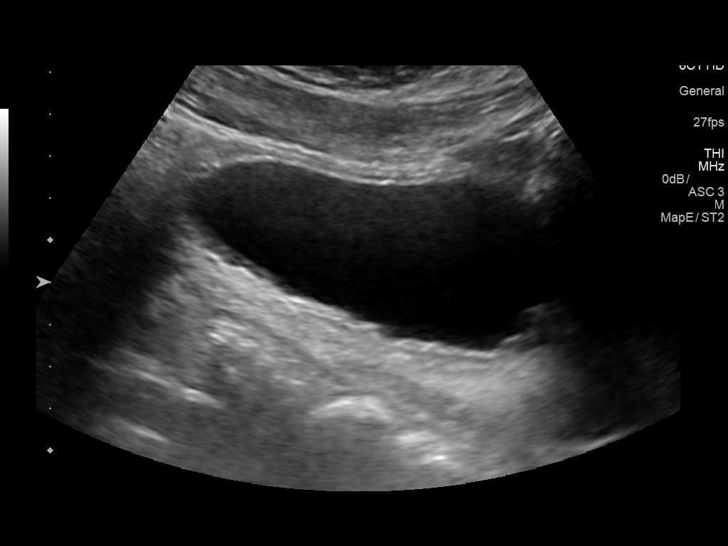

[14 of 21 positions shown; findings below may reference images not displayed]

FINDINGS: Right Kidney:

Length: 10.4 cm. Echogenicity within normal limits. No mass or
hydronephrosis visualized.

Left Kidney:

Length: 10.4 cm. 1.1 cm upper pole cyst. Normal echogenicity. No
hydronephrosis.

Bladder:

Appears normal for degree of bladder distention.
IMPRESSION: No significant abnormality.
